# Patient Record
Sex: Male | Born: 1945 | Race: White | Hispanic: No | Marital: Single
Health system: Southern US, Community
[De-identification: ages and names within clinical notes are randomized; demographics above are authoritative.]

---

## 2015-12-16 ENCOUNTER — Emergency Department (HOSPITAL_COMMUNITY): Payer: Medicare Other

## 2015-12-16 ENCOUNTER — Emergency Department (HOSPITAL_COMMUNITY)
Admission: EM | Admit: 2015-12-16 | Discharge: 2015-12-16 | Disposition: A | Discharge status: Home / Self Care | Payer: Medicare Other | Attending: Emergency Medicine | Admitting: Emergency Medicine

## 2015-12-16 DIAGNOSIS — H913 Deaf nonspeaking, not elsewhere classified: Secondary | ICD-10-CM | POA: Insufficient documentation

## 2015-12-16 DIAGNOSIS — M25562 Pain in left knee: Secondary | ICD-10-CM | POA: Diagnosis not present

## 2015-12-16 DIAGNOSIS — M25552 Pain in left hip: Secondary | ICD-10-CM | POA: Diagnosis not present

## 2015-12-16 DIAGNOSIS — M79605 Pain in left leg: Secondary | ICD-10-CM | POA: Diagnosis present

## 2015-12-16 MED ORDER — NAPROXEN 500 MG PO TABS: 500.0000 mg | ORAL_TABLET | Freq: Two times a day (BID) | ORAL | Status: DC

## 2015-12-16 MED ORDER — TRAMADOL HCL 50 MG PO TABS
50.0000 mg | ORAL_TABLET | Freq: Once | ORAL | Status: AC
  Administered 2015-12-16: 50 mg via ORAL
  Filled 2015-12-16: qty 1
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
## 2015-12-16 NOTE — ED Notes (Signed)
Pt is deaf, interpreter has been c alled for

## 2015-12-16 NOTE — ED Notes (Signed)
Pt made aware by secretary that interpretor will arrive in 40 minutes.

## 2015-12-16 NOTE — Discharge Instructions (Signed)
It is important for you to follow-up with primary care for definitive care of your knee pain. Your x-ray showed no broken bones or dislocations. It does show some inflammation and a fluid sac over the top of your left knee. Your anti-inflammatory medication will help with this as well your ace wrap. Take your medication as we discussed and as prescribed. Return to ED for any new or worsening symptoms.  Joint Pain Joint pain, which is also called arthralgia, can be caused by many things. Joint pain often goes away when you follow your health care provider's instructions for relieving pain at home. However, joint pain can also be caused by conditions that require further treatment. Common causes of joint pain include:  Bruising in the area of the joint.  Overuse of the joint.  Wear and tear on the joints that occur with aging (osteoarthritis).  Various other forms of arthritis.  A buildup of a crystal form of uric acid in the joint (gout).  Infections of the joint (septic arthritis) or of the bone (osteomyelitis). Your health care provider may recommend medicine to help with the pain. If your joint pain continues, additional tests may be needed to diagnose your condition. HOME CARE INSTRUCTIONS Watch your condition for any changes. Follow these instructions as directed to lessen the pain that you are feeling.  Take medicines only as directed by your health care provider.  Rest the affected area for as long as your health care provider says that you should. If directed to do so, raise the painful joint above the level of your heart while you are sitting or lying down.  Do not do things that cause or worsen pain.  If directed, apply ice to the painful area:  Put ice in a plastic bag.  Place a towel between your skin and the bag.  Leave the ice on for 20 minutes, 2-3 times per day.  Wear an elastic bandage, splint, or sling as directed by your health care provider. Loosen the elastic  bandage or splint if your fingers or toes become numb and tingle, or if they turn cold and blue.  Begin exercising or stretching the affected area as directed by your health care provider. Ask your health care provider what types of exercise are safe for you.  Keep all follow-up visits as directed by your health care provider. This is important. SEEK MEDICAL CARE IF:  Your pain increases, and medicine does not help.  Your joint pain does not improve within 3 days.  You have increased bruising or swelling.  You have a fever.  You lose 10 lb (4.5 kg) or more without trying. SEEK IMMEDIATE MEDICAL CARE IF:  You are not able to move the joint.  Your fingers or toes become numb or they turn cold and blue.   This information is not intended to replace advice given to you by your health care provider. Make sure you discuss any questions you have with your health care provider.   Document Released: 09/09/2005 Document Revised: 09/30/2014 Document Reviewed: 06/21/2014 Elsevier Interactive Patient Education [employer]2016 [employer]vier Inc.

## 2015-12-16 NOTE — ED Notes (Signed)
He c/o pain from L knee to L hip for several years now. He has not seen a doctor for this pain. He state it hurts worse to ambulate. Cms intact. He is deaf

## 2015-12-16 NOTE — ED Notes (Signed)
D/c teaching done with pt and roommate with interpretor at bedside.  Pt verbalized understanding of all d/c instructions.

## 2016-12-17 ENCOUNTER — Emergency Department (HOSPITAL_COMMUNITY): Payer: Medicare Other

## 2016-12-17 ENCOUNTER — Emergency Department (HOSPITAL_COMMUNITY)
Admission: EM | Admit: 2016-12-17 | Discharge: 2016-12-17 | Disposition: A | Discharge status: Home / Self Care | Payer: Medicare Other | Attending: Emergency Medicine | Admitting: Emergency Medicine

## 2016-12-17 DIAGNOSIS — M25562 Pain in left knee: Secondary | ICD-10-CM | POA: Insufficient documentation

## 2016-12-17 DIAGNOSIS — G8929 Other chronic pain: Secondary | ICD-10-CM

## 2016-12-17 DIAGNOSIS — M25561 Pain in right knee: Secondary | ICD-10-CM | POA: Diagnosis not present

## 2016-12-17 MED ORDER — OXYCODONE-ACETAMINOPHEN 5-325 MG PO TABS
1.0000 | ORAL_TABLET | Freq: Once | ORAL | Status: AC
  Administered 2016-12-17: 1 via ORAL
  Filled 2016-12-17: qty 1

## 2016-12-17 MED ORDER — NAPROXEN 500 MG PO TABS: 500.0000 mg | ORAL_TABLET | Freq: Two times a day (BID) | ORAL | 0 refills | Status: AC

## 2016-12-17 NOTE — ED Notes (Signed)
Pt states he understands instructions . Information given through Cit[employer]interpretor stratus.

## 2016-12-17 NOTE — Progress Notes (Signed)
Orthopedic Tech Progress Note Patient Details:  Teressa SenterMickey Hobbins 1945-12-30 [id number]  Ortho Devices Type of Ortho Device: Knee Sleeve Ortho Device/Splint Location: rle Ortho Device/Splint Interventions: Application   Toshiye Kever 12/17/2016, 2:46 PM

## 2016-12-17 NOTE — ED Notes (Signed)
Patient taken to xray.
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
## 2018-04-14 IMAGING — DX DG KNEE COMPLETE 4+V*R*
4 series · 4 of 4 positions shown · non-contrast
Comparison: None.

CLINICAL DATA: Recent fall with right knee pain, initial encounter

EXAM:
RIGHT KNEE - COMPLETE 4+ VIEW

[x knee ap right]
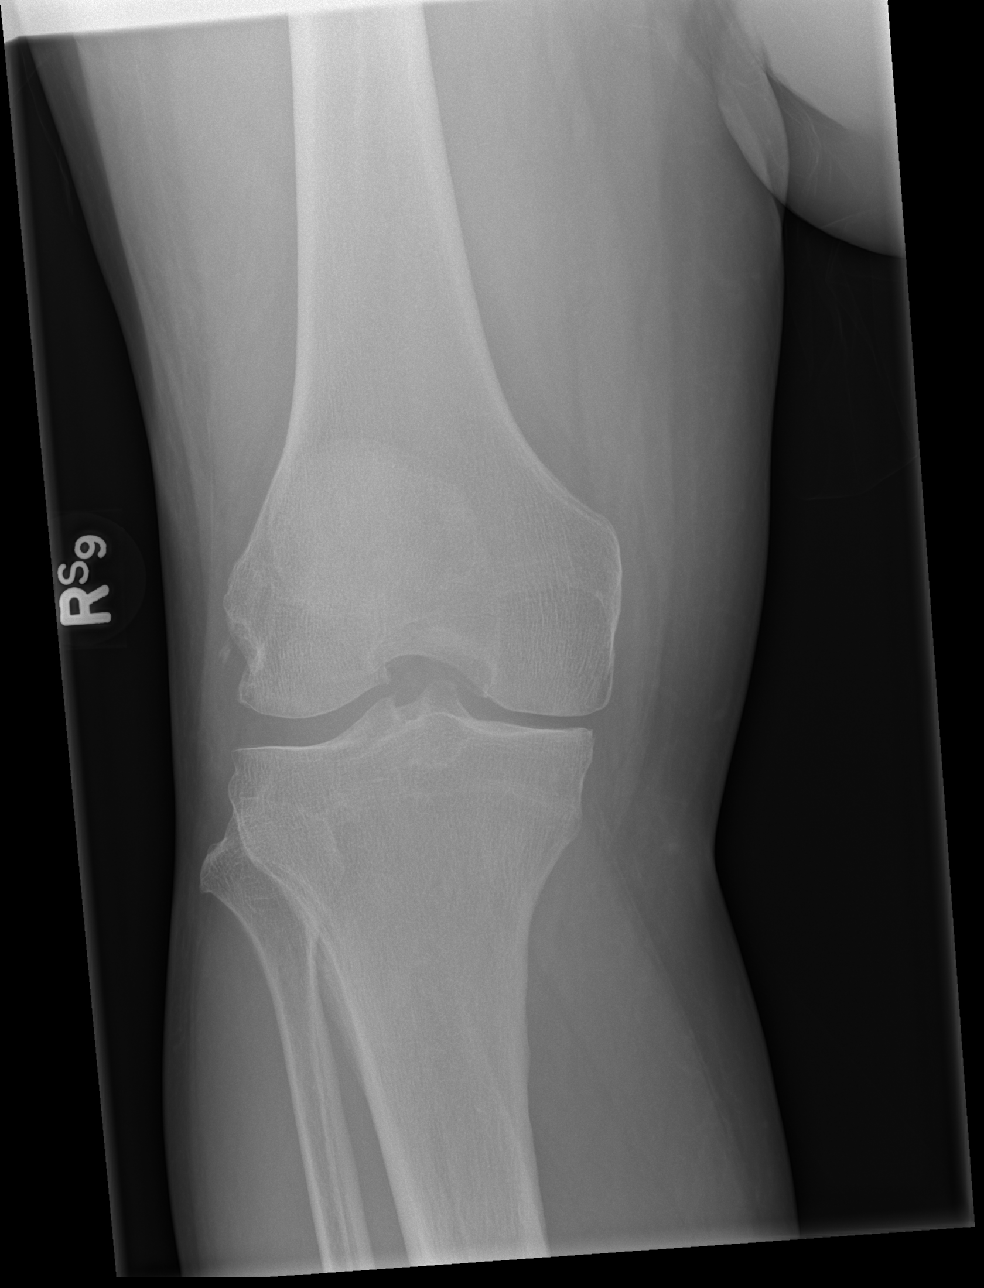

[x knee obl right (1 of 2)]
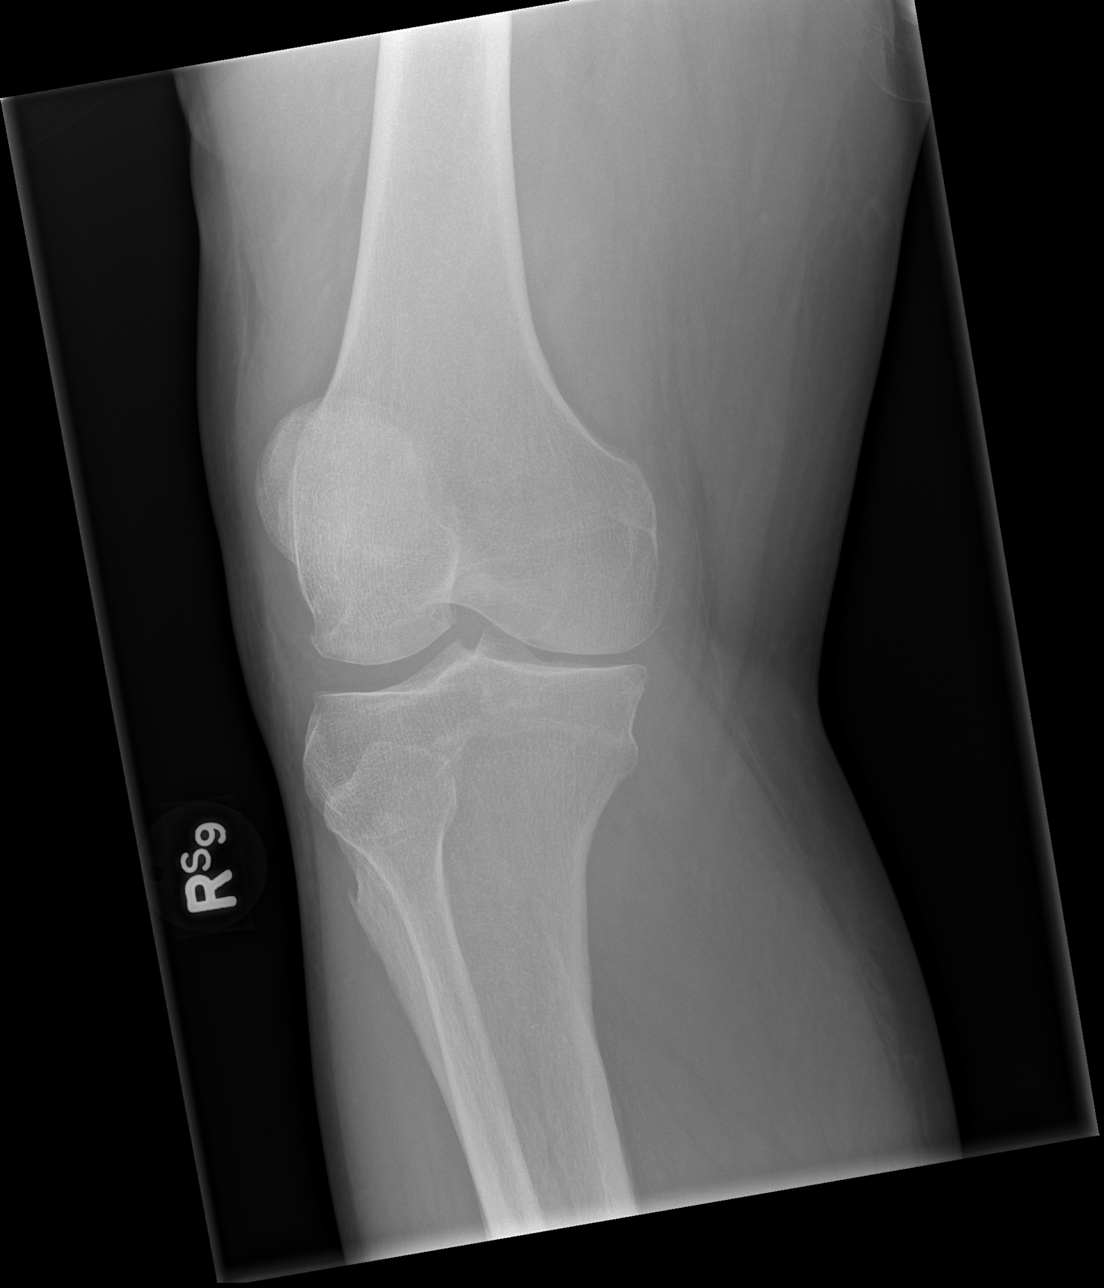

[x knee obl right (2 of 2)]
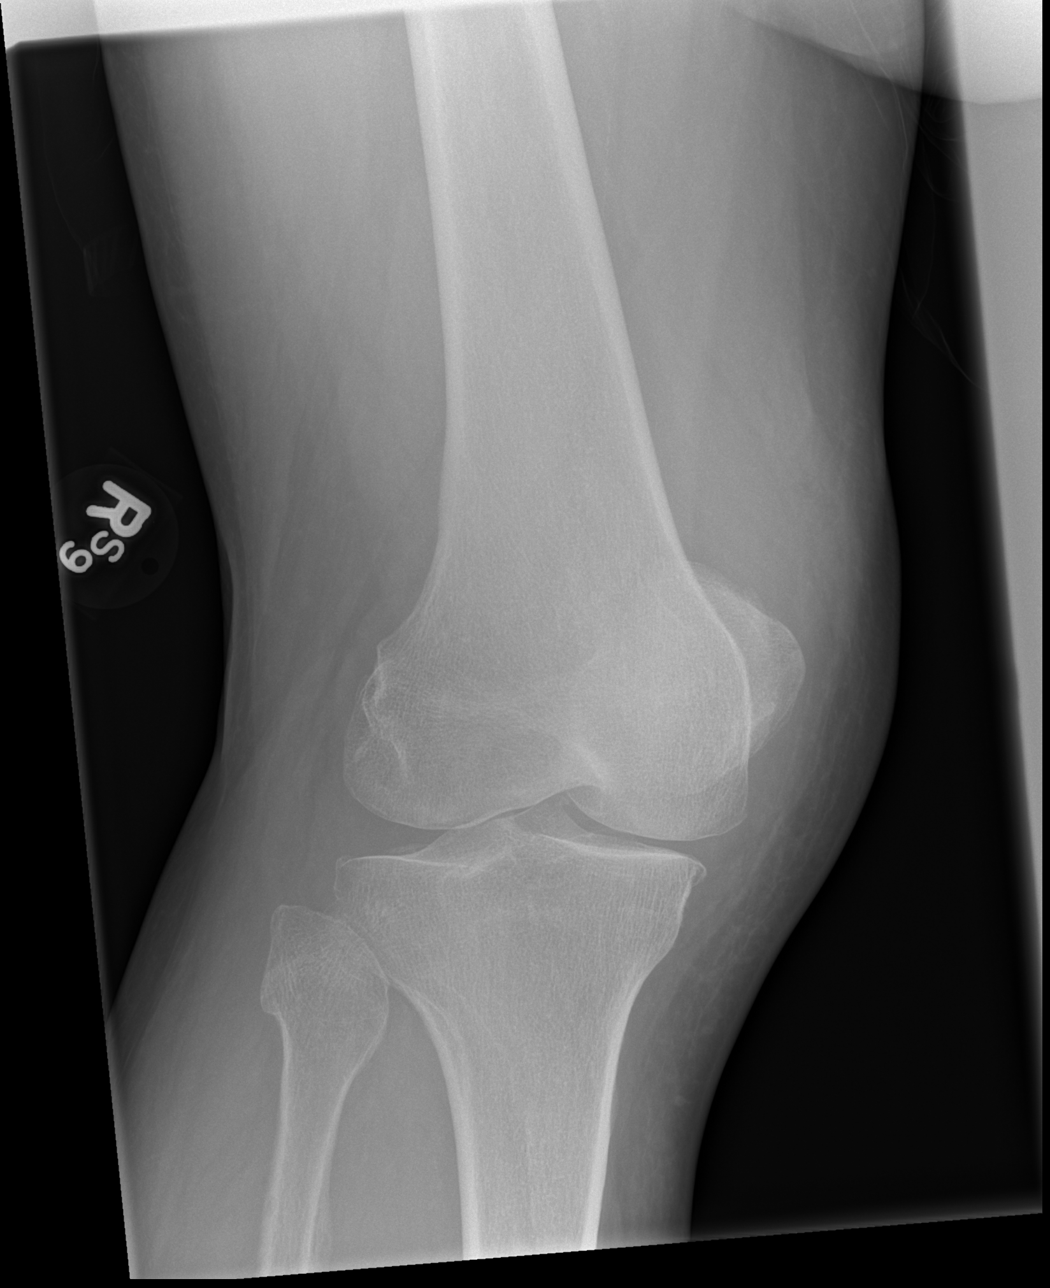

[x knee lat right]
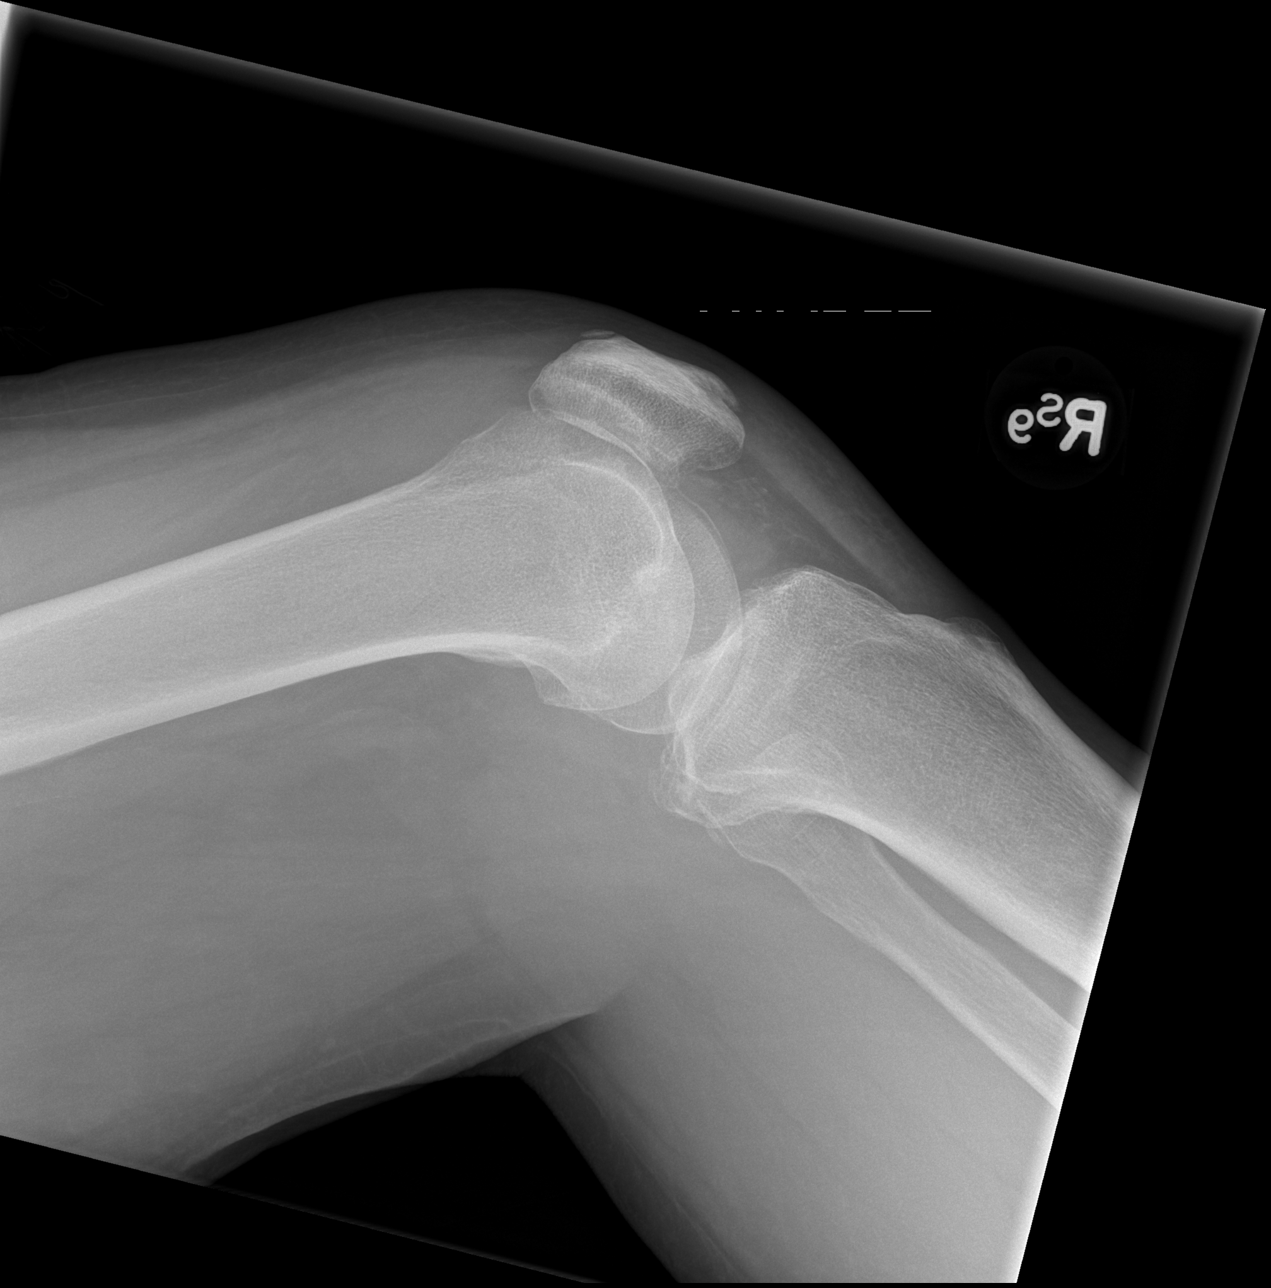

[4 of 4 positions shown; findings below may reference images not displayed]

FINDINGS: Medial joint space narrowing is identified. No acute fracture or
dislocation is noted. Small joint effusion is noted.
IMPRESSION: Mild degenerative change with small joint effusion. No acute bony
abnormality is noted.

## 2018-12-11 ENCOUNTER — Ambulatory Visit (HOSPITAL_COMMUNITY)
Admission: EM | Admit: 2018-12-11 | Discharge: 2018-12-11 | Discharge status: Against Medical Advice | Payer: Medicare Other
